# Patient Record
Sex: Female | Born: 1985 | Race: White | Hispanic: No | Marital: Married | Smoking: Never smoker
Health system: Southern US, Community
[De-identification: ages and names within clinical notes are randomized; demographics above are authoritative.]

## PROBLEM LIST (undated history)

## (undated) DIAGNOSIS — I214 Non-ST elevation (NSTEMI) myocardial infarction: Secondary | ICD-10-CM

## (undated) DIAGNOSIS — I2699 Other pulmonary embolism without acute cor pulmonale: Secondary | ICD-10-CM

## (undated) DIAGNOSIS — I2781 Cor pulmonale (chronic): Secondary | ICD-10-CM

## (undated) HISTORY — PX: TONSILLECTOMY: SUR1361

## (undated) HISTORY — PX: HERNIA REPAIR: SHX51

---
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
## 2015-09-04 ENCOUNTER — Emergency Department (HOSPITAL_COMMUNITY): Payer: Medicaid Other

## 2015-09-04 ENCOUNTER — Encounter (HOSPITAL_COMMUNITY): Payer: Self-pay

## 2015-09-04 ENCOUNTER — Emergency Department (HOSPITAL_COMMUNITY)
Admission: EM | Admit: 2015-09-04 | Discharge: 2015-09-04 | Disposition: A | Discharge status: Home / Self Care | Payer: Medicaid Other | Attending: Emergency Medicine | Admitting: Emergency Medicine

## 2015-09-04 DIAGNOSIS — I2782 Chronic pulmonary embolism: Secondary | ICD-10-CM | POA: Diagnosis not present

## 2015-09-04 DIAGNOSIS — R079 Chest pain, unspecified: Secondary | ICD-10-CM

## 2015-09-04 DIAGNOSIS — Z79899 Other long term (current) drug therapy: Secondary | ICD-10-CM | POA: Insufficient documentation

## 2015-09-04 DIAGNOSIS — M549 Dorsalgia, unspecified: Secondary | ICD-10-CM | POA: Insufficient documentation

## 2015-09-04 HISTORY — DX: Non-ST elevation (NSTEMI) myocardial infarction: I21.4

## 2015-09-04 HISTORY — DX: Cor pulmonale (chronic): I27.81

## 2015-09-04 HISTORY — DX: Other pulmonary embolism without acute cor pulmonale: I26.99

## 2015-09-04 LAB — BASIC METABOLIC PANEL
Anion gap: 6 (ref 5–15)
BUN: 8 mg/dL (ref 6–20)
CHLORIDE: 106 mmol/L (ref 101–111)
CO2: 26 mmol/L (ref 22–32)
Calcium: 9 mg/dL (ref 8.9–10.3)
Creatinine, Ser: 0.68 mg/dL (ref 0.44–1.00)
GFR calc Af Amer: >60 mL/min (ref >60)
GFR calc non Af Amer: >60 mL/min (ref >60)
GLUCOSE: 84 mg/dL (ref 65–99)
POTASSIUM: 3.7 mmol/L (ref 3.5–5.1)
Sodium: 138 mmol/L (ref 135–145)

## 2015-09-04 LAB — CBC
HEMATOCRIT: 37.9 % (ref 36.0–46.0)
HEMOGLOBIN: 12.5 g/dL (ref 12.0–15.0)
MCH: 29.2 pg (ref 26.0–34.0)
MCHC: 33 g/dL (ref 30.0–36.0)
MCV: 88.6 fL (ref 78.0–100.0)
Platelets: 313 10*3/uL (ref 150–400)
RBC: 4.28 MIL/uL (ref 3.87–5.11)
RDW: 13.7 % (ref 11.5–15.5)
WBC: 7.9 10*3/uL (ref 4.0–10.5)

## 2015-09-04 LAB — TROPONIN I
Troponin I: <0.03 ng/mL (ref <0.031)
Troponin I: <0.03 ng/mL (ref <0.031)

## 2015-09-04 MED ORDER — METHYLPREDNISOLONE SODIUM SUCC 125 MG IJ SOLR
125.0000 mg | Freq: Once | INTRAMUSCULAR | Status: AC
  Administered 2015-09-04: 125 mg via INTRAVENOUS
  Filled 2015-09-04: qty 2

## 2015-09-04 MED ORDER — DIPHENHYDRAMINE HCL 50 MG/ML IJ SOLN
50.0000 mg | Freq: Once | INTRAMUSCULAR | Status: AC
  Administered 2015-09-04: 50 mg via INTRAVENOUS

## 2015-09-04 MED ORDER — SODIUM CHLORIDE 0.9 % IV BOLUS (SEPSIS)
500.0000 mL | Freq: Once | INTRAVENOUS | Status: AC
  Administered 2015-09-04: 500 mL via INTRAVENOUS

## 2015-09-04 MED ORDER — IOPAMIDOL (ISOVUE-370) INJECTION 76%
100.0000 mL | Freq: Once | INTRAVENOUS | Status: AC | PRN
  Administered 2015-09-04: 100 mL via INTRAVENOUS

## 2015-09-04 MED ORDER — DIPHENHYDRAMINE HCL 50 MG/ML IJ SOLN
50.0000 mg | Freq: Once | INTRAMUSCULAR | Status: AC
  Administered 2015-09-04: 50 mg via INTRAVENOUS
  Filled 2015-09-04: qty 1

## 2015-09-04 MED ORDER — OXYCODONE-ACETAMINOPHEN 5-325 MG PO TABS
2.0000 | ORAL_TABLET | Freq: Once | ORAL | Status: AC
  Administered 2015-09-04: 2 via ORAL
  Filled 2015-09-04: qty 2

## 2015-09-04 MED ORDER — FENTANYL CITRATE (PF) 100 MCG/2ML IJ SOLN
50.0000 ug | Freq: Once | INTRAMUSCULAR | Status: AC
  Administered 2015-09-04: 50 ug via INTRAVENOUS
  Filled 2015-09-04: qty 2

## 2015-09-04 NOTE — ED Provider Notes (Addendum)
7:12 PM [facility], please see their note for full history, physical and decision making until this point. In brief this is a 30 y.o. year old female who presented to the ED tonight with Chest Pain     Had a recent admission for saddle embolus and is here with acute worsening of her chest pain as of last night. Is awaiting CT scan to evaluate for new PE and if so a change in medications.  CT scan returned without any new blood clots just resolving old pulmonary emboli. Patient pain-free at this time. Plan to follow with Dr. as directed and will continue Xarelto as needed. She also is low risk for ACS and had delta troponins which were negative making ACS unlikely.  Discharge instructions, including strict return precautions for new or worsening symptoms, given. Patient and/or family verbalized understanding and agreement with the plan as described.   Labs, studies and imaging reviewed by myself and considered in medical decision making if ordered. Imaging interpreted by radiology.  Labs Reviewed  BASIC METABOLIC PANEL  CBC  TROPONIN I  TROPONIN I    CT Angio Chest PE W/Cm &/Or Wo Cm  Final Result    DG Chest 2 View  Final Result      No Follow-up on file.   Marily MemosJason Giorgio Chabot, MD 09/06/15 1504

## 2015-09-04 NOTE — ED Provider Notes (Signed)
CSN: [id number]     Arrival date & time 09/04/15  1105 History  By signing my name below, I, Majel HomerPeyton Lee, attest that this documentation has been prepared under the direction and in the presence of Blane OharaJoshua Michel Eskelson, MD . Electronically Signed: Majel HomerPeyton Lee, Scribe. 09/04/2015. 12:26 PM.   Chief Complaint  Patient presents with  . Chest Pain   The history is provided by the patient. No language interpreter was used.   HPI Comments:  Olivia Byrd is a 30 y.o. female with a PMHx of NSTEMI and multiple PEs, who presents to the Emergency Department complaining of gradual onset, gradually worsening, chest pain that began last night. Pt states associated symptoms of back tightness.  Pt states [facility] for shortness of breath and chest pain that began three days to that visit. She was [facility] where she was diagnosed with acute saddle PE, multiple bilateral PE, and NSTEMI ([facility]). She was discharged on 09/02/15. She notes episode today is dissimilar as the back tightness was not present when she was diagnosed with her MI and PE last week. Pt states that she was taking birth control pills but was taken off of it after her PE was diagnosed.She denies family h/o blood clots. She is currently taking Xarelto BID and is complaint. Pt denies swelling in legs, fever, and cough.   Pt states she is allergic to CT contrast; broke out in rash.    Past Medical History  Diagnosis Date  . Pulmonary embolism (HCC)   . Cor pulmonale (chronic) (HCC)   . Non-ST elevated myocardial infarction [facility](HCC)    Past Surgical History  Procedure Laterality Date  . Hernia repair    . Tonsillectomy     History reviewed. No pertinent family history. Social History  Substance Use Topics  . Smoking status: Never Smoker   . Smokeless tobacco: None  . Alcohol Use: No   OB History    No data available     Review of Systems  Constitutional: Negative for  fever.  Respiratory: Negative for cough.   Cardiovascular: Positive for chest pain. Negative for leg swelling.  Musculoskeletal: Positive for back pain.  All other systems reviewed and are negative.  Allergies  Augmentin; Ceclor; Iodinated diagnostic agents; and Keflex  Home Medications   Prior to Admission medications   Medication Sig Start Date End Date Taking? Authorizing Provider  nitrofurantoin, macrocrystal-monohydrate, (MACROBID) 100 MG capsule TAKE 1 CAPSULE BY MOUTH 2 TIMES A DAY FOR 10 DAYS 08/26/15  Yes Historical Provider, MD  Rivaroxaban (XARELTO) 15 MG TABS tablet Take 15 mg by mouth 2 (two) times daily with a meal.  08/26/15 09/16/15 Yes Historical Provider, MD   BP 113/71 mmHg  Pulse 96  Temp(Src) 98.5 F (36.9 C) (Oral)  Resp 15  Ht 5\' 1"  (1.549 m)  Wt 164 lb (74.39 kg)  BMI 31.00 kg/m2  SpO2 99%  LMP 08/29/2015 (Approximate) Physical Exam  Constitutional: She is oriented to person, place, and time. She appears well-developed and well-nourished.  HENT:  Head: Normocephalic and atraumatic.  Eyes: Conjunctivae are normal. Right eye exhibits no discharge. Left eye exhibits no discharge.  Neck: Normal range of motion. Neck supple. No tracheal deviation present.  Cardiovascular: Normal rate and regular rhythm.   Pulmonary/Chest: Effort normal and breath sounds normal.  Lungs clear  Abdominal: Soft. She exhibits no distension. There is no tenderness. There is no guarding.  Abdomen soft,  non tender   Musculoskeletal: She exhibits no edema.  No tenderness in calves, no swelling on exam  Neurological: She is alert and oriented to person, place, and time.  Skin: Skin is warm. No rash noted.  Psychiatric: She has a normal mood and affect.  Nursing note and vitals reviewed.   ED Course  Procedures  DIAGNOSTIC STUDIES:  Oxygen Saturation is 100% on RA, normal by my interpretation.    COORDINATION OF CARE:  12:26 PM Discussed treatment plan with pt at bedside  which includes CT chest and pt agreed to plan.   EMERGENCY DEPARTMENT [place] CARDIAC EXAM performed by Blane Ohara, MD "Study: Limited Ultrasound of the heart and pericardium"  INDICATIONS:Dyspnea Multiple views of the heart and pericardium are obtained with a multi-frequency probe.  PERFORMED ZO:XWRUEA  IMAGES ARCHIVED?: Yes  FINDINGS: No pericardial effusion and Normal contractility  LIMITATIONS:  Body habitus  VIEWS USED: Subcostal 4 chamber, Parasternal long axis, Parasternal short axis, Apical 4 chamber  and Inferior Vena Cava  INTERPRETATION: Cardiac activity present, Pericardial effusioin absent, Cardiac tamponade absent, Volume status normal and Normal contractility  COMMENT:    Labs Review Labs Reviewed  BASIC METABOLIC PANEL  CBC  TROPONIN I  TROPONIN I    Imaging Review No results found. I have personally reviewed and evaluated these images and lab results as part of my medical decision-making.   EKG Interpretation   Date/Time:  Tuesday Sep 04 2015 11:41:37 EDT Ventricular Rate:  62 PR Interval:  122 QRS Duration: 83 QT Interval:  449 QTC Calculation: 456 R Axis:   36 Text Interpretation:  Sinus rhythm Confirmed by Shatoya Roets MD, Amanda Pote [phone])  on 09/04/2015 11:50:59 AM      MDM   Final diagnoses:  Chest pain, unspecified chest pain type  Other chronic pulmonary embolism without acute cor pulmonale (HCC)   Pt with atypical CP, hx of PE, compliant with medicines. Low risk cardiac.  Pt improved in ED, no CP on recheck, sob resolved.  Plan for CTA if no new clot burden continue with mediciness and fup with pcp.  Signed out for final dispo.  Filed Vitals:   09/04/15 1730 09/04/15 1900  BP: 111/68 113/71  Pulse: 94 96  Temp:    Resp: 17 15      Blane Ohara, MD 09/12/15 1109

## 2015-09-04 NOTE — ED Notes (Signed)
Pt given fluids with MD approval.

## 2015-09-04 NOTE — ED Notes (Signed)
[facility] and they diagnosed me with non-ST elevated MI.  [facility].  Was diagnosed with acute saddle pulmonary embolism with acute cor pulmonale.  Bi-lateral PE. Placed on Xarelto.  Started having a dull chest pain last night, spoke with my MD and they said to come to the ER.

## 2015-09-04 NOTE — ED Notes (Signed)
Delay in getting EKG due to quality of picture.

## 2015-09-04 NOTE — ED Notes (Signed)
MD at bedside. (Dr Clayborne DanaMesner)

## 2017-04-14 IMAGING — DX DG CHEST 2V
2 series · 2 of 2 positions shown · non-contrast
Comparison: CTA chest 08/24/2015.  Chest x-ray 08/23/2015.

CLINICAL DATA: 30-year-old with acute superimposed upon chronic
left-sided chest pain. Recent diagnosis of multiple bilateral
pulmonary emboli on 08/24/2015.

EXAM:
CHEST  2 VIEW

[chest pa]
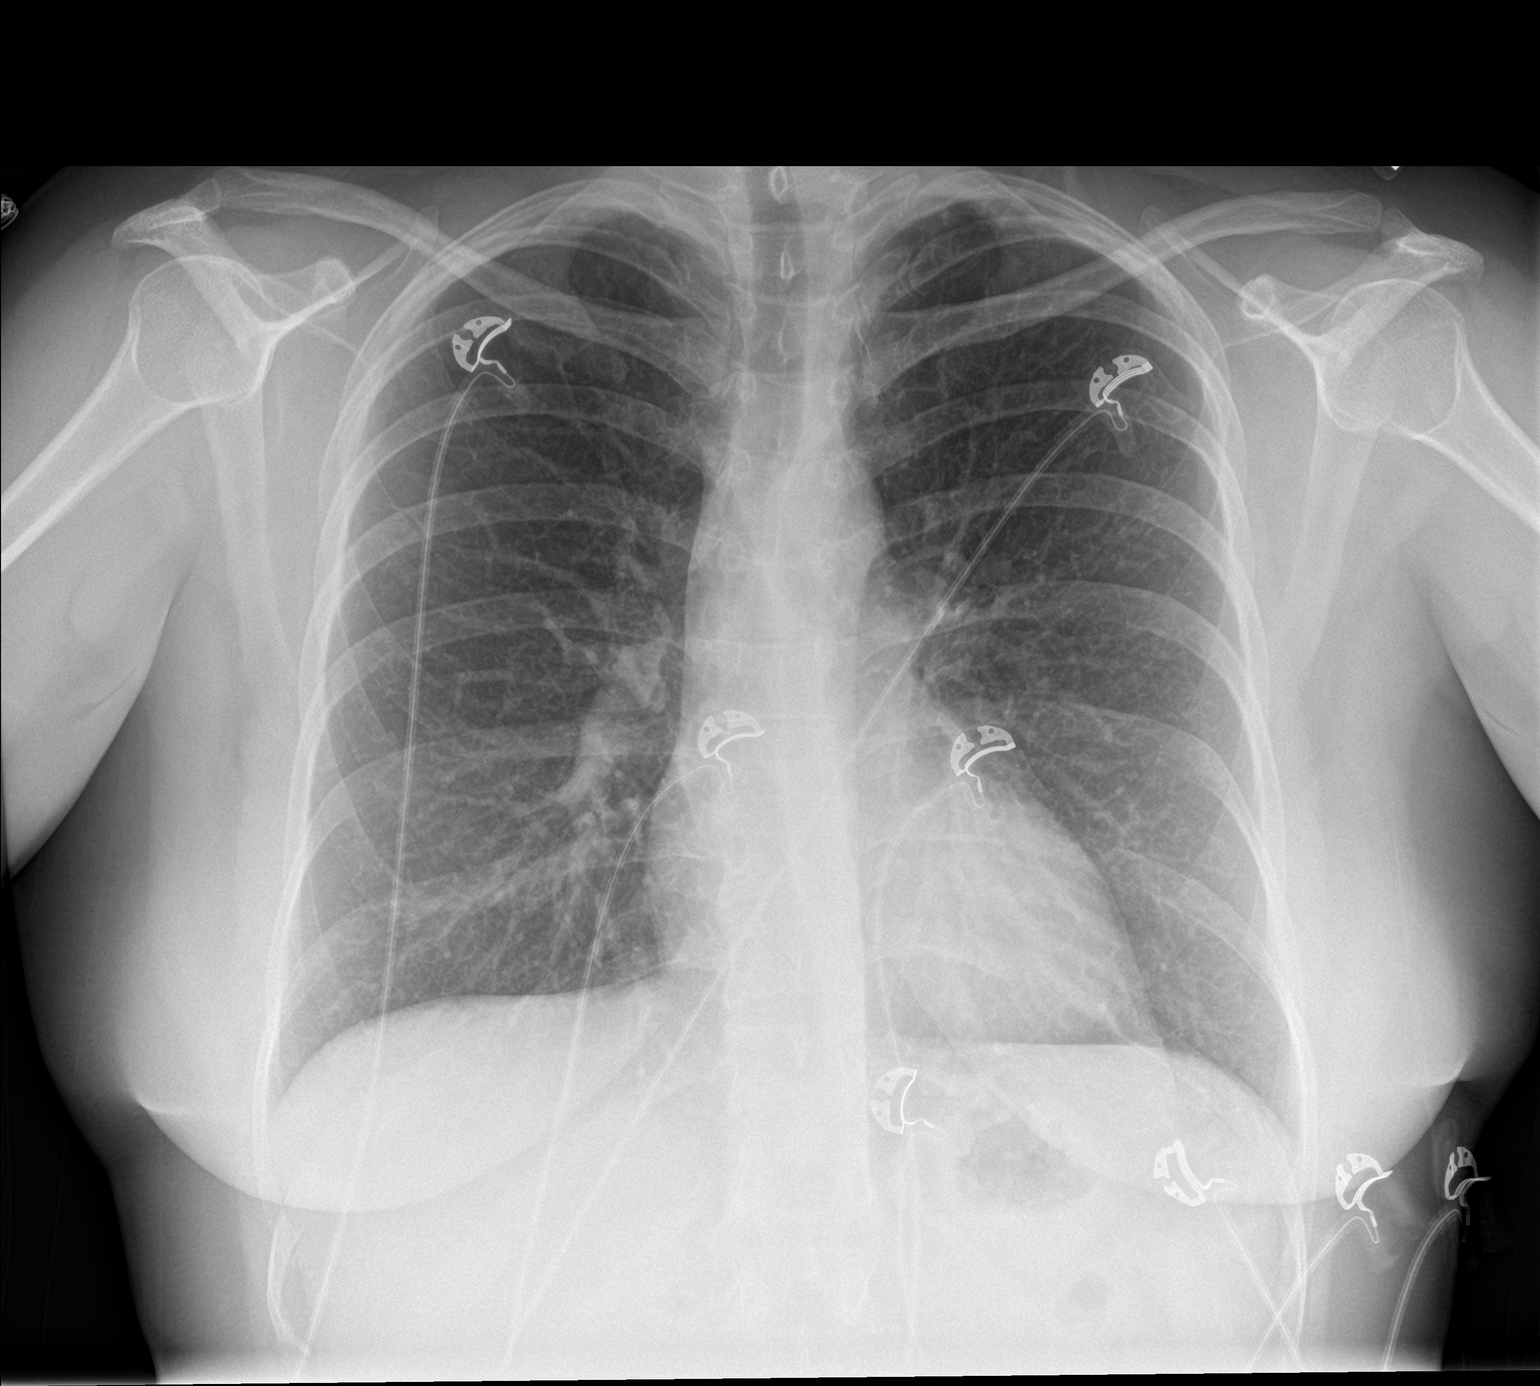

[chest lat]
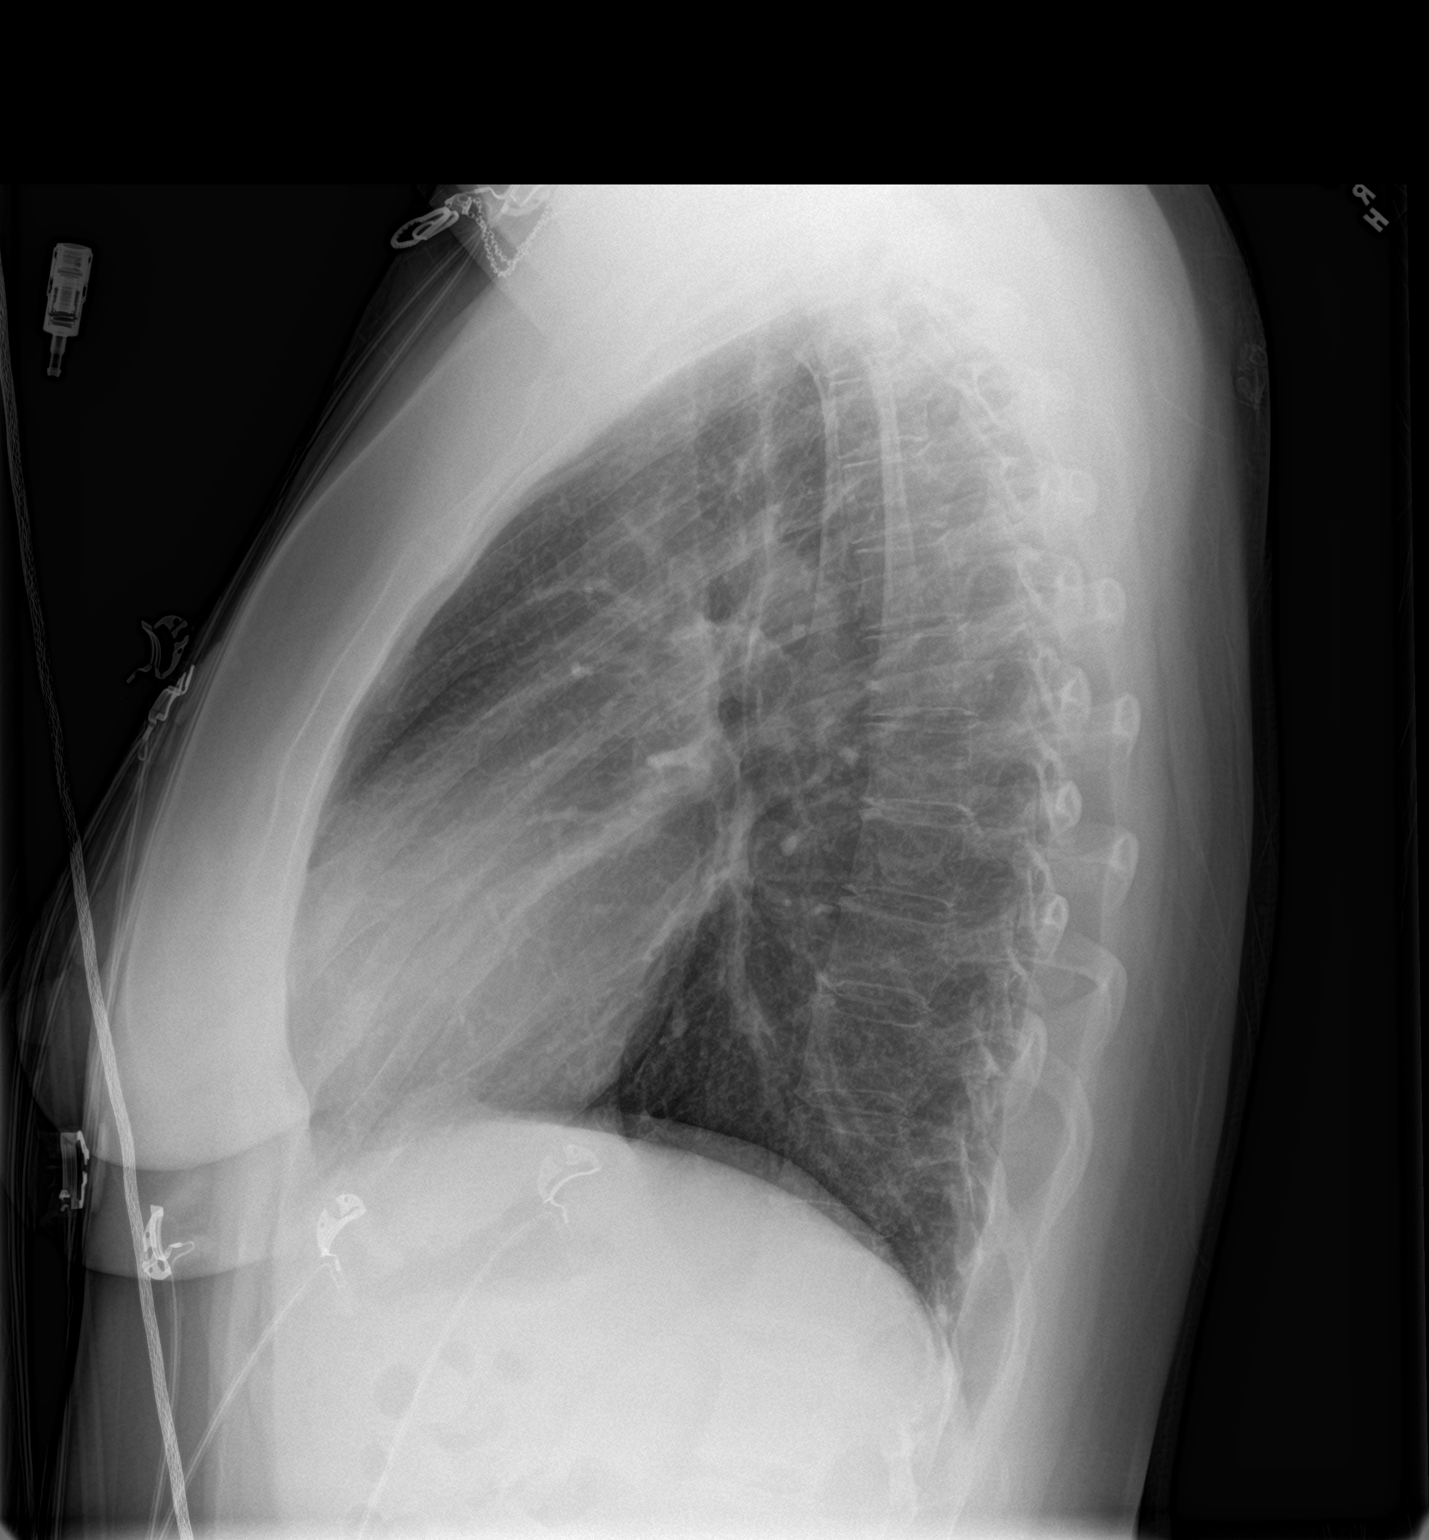

[2 of 2 positions shown; findings below may reference images not displayed]

FINDINGS: Cardiomediastinal silhouette unremarkable, unchanged. Lungs clear.
Bronchovascular markings normal. Pulmonary vascularity normal. No
visible pleural effusions. No pneumothorax. Visualized bony thorax
intact.
IMPRESSION: No acute cardiopulmonary disease.

## 2017-04-14 IMAGING — CT CT ANGIO CHEST
2 of 7 series · 17 of 46 positions shown · IV contrast (ISOVUE)
Comparison: CTA chest dated 08/24/2015

CLINICAL DATA: Recent bilateral P on 08/23/2015. Dull chest pain
starting last night.

EXAM:
CT ANGIOGRAPHY CHEST WITH CONTRAST
TECHNIQUE: Multidetector CT imaging of the chest was performed using the
standard protocol during bolus administration of intravenous
contrast. Multiplanar CT image reconstructions and MIPs were
obtained to evaluate the vascular anatomy.
CONTRAST:  100 cc Isovue 370

[Series 6: pe thins 1.0 · axial · 0.71mm/px · z∈[-280,-32]mm · 14 of 288 slices shown]
[im 20/288  lung]
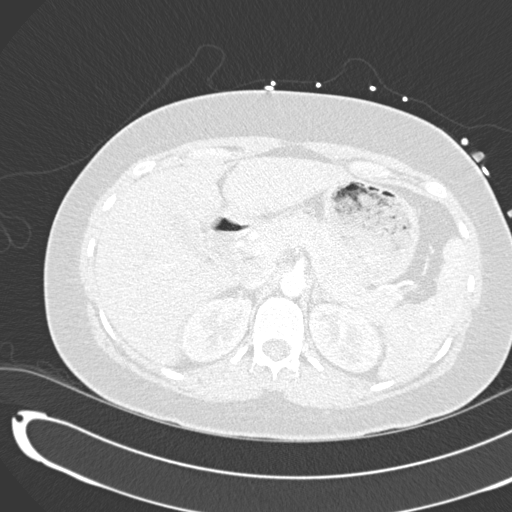
[im 39/288  soft-tissue]
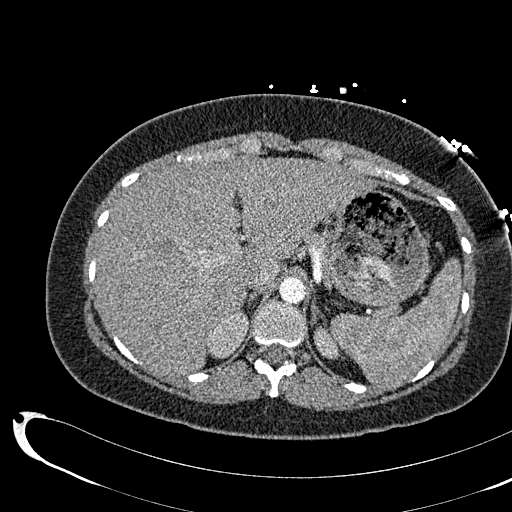
[im 58/288  lung]
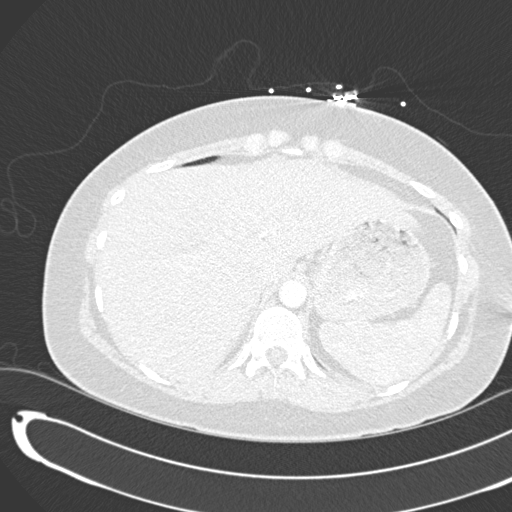
[im 77/288  soft-tissue]
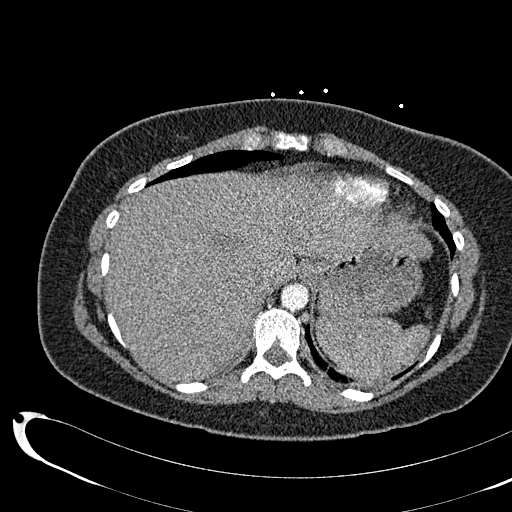
[im 96/288  lung]
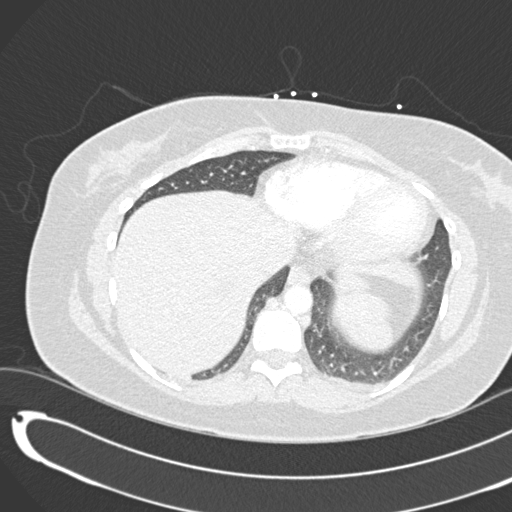
[im 115/288  soft-tissue]
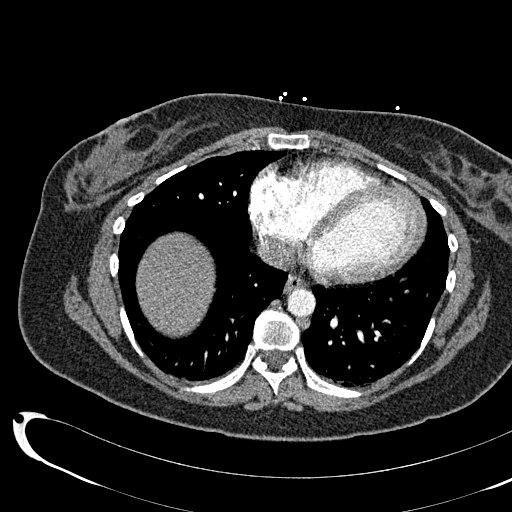
[im 134/288  lung]
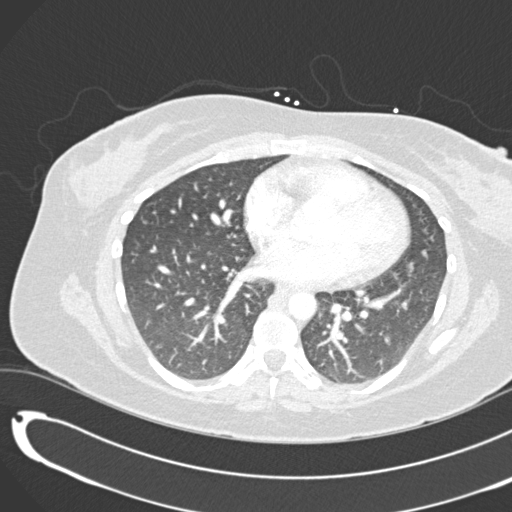
[im 154/288  soft-tissue]
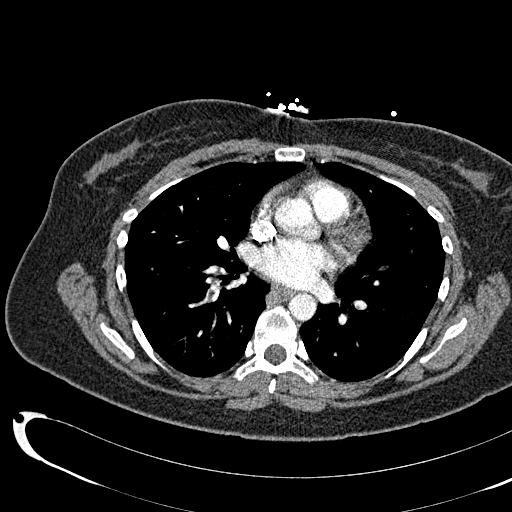
[im 173/288  lung]
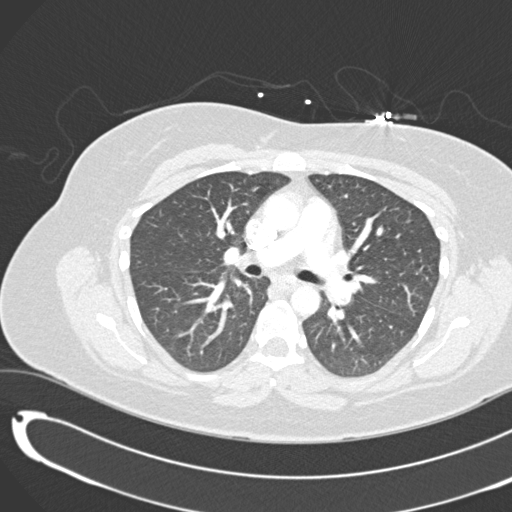
[im 192/288  soft-tissue]
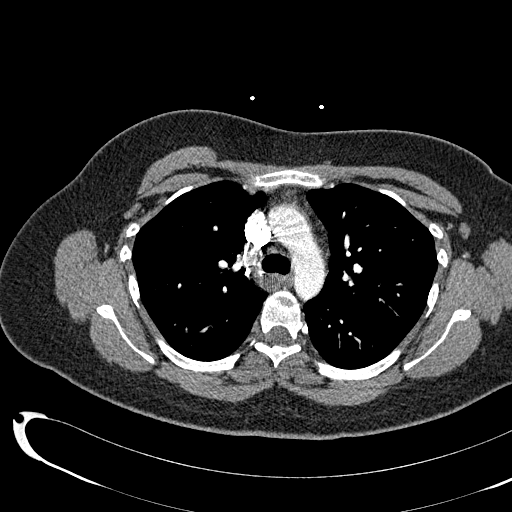
[im 211/288  lung]
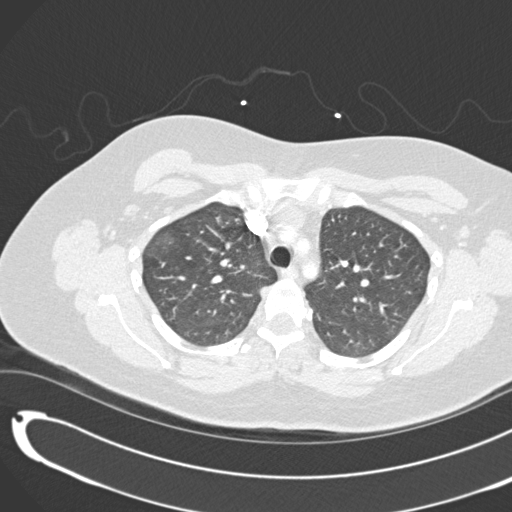
[im 230/288  soft-tissue]
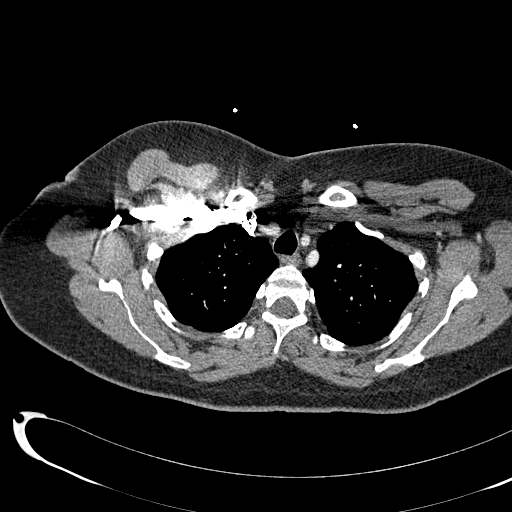
[im 249/288  lung]
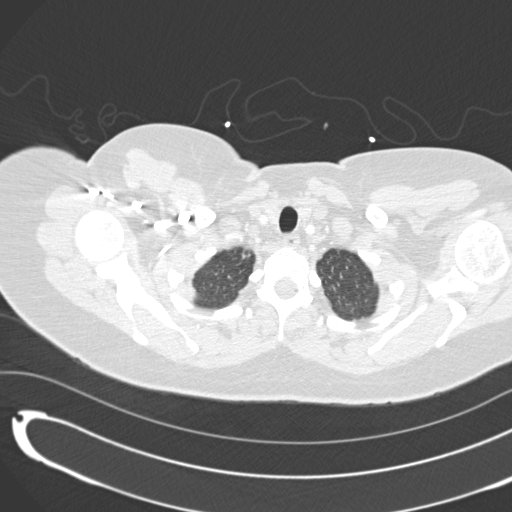
[im 268/288  soft-tissue]
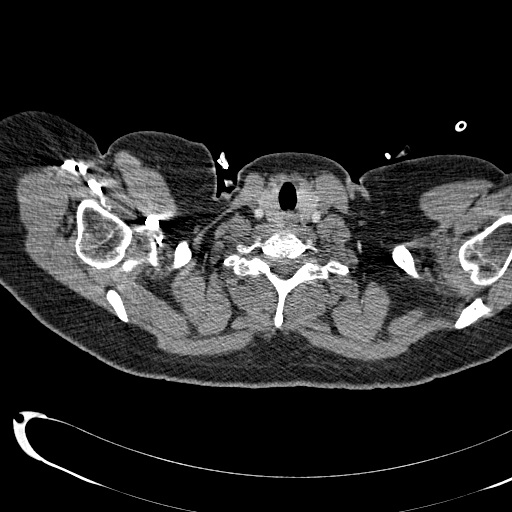

[Series 9: cor mpr 2.0 · coronal · 0.56mm/px · 3 of 128 slices shown]
[im 32/128  soft-tissue]
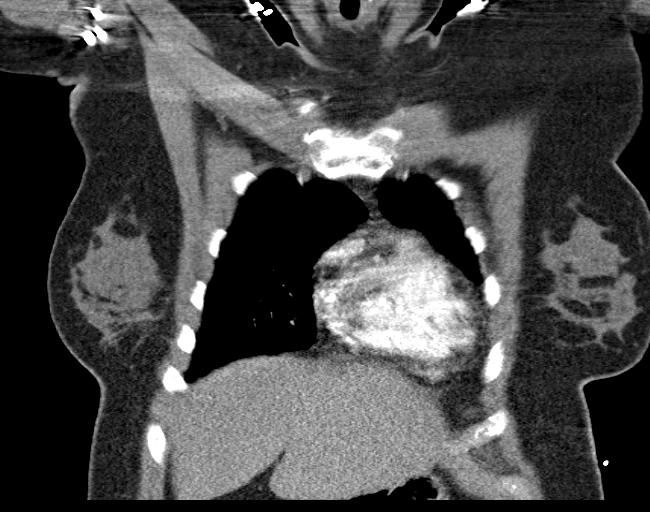
[im 64/128  soft-tissue]
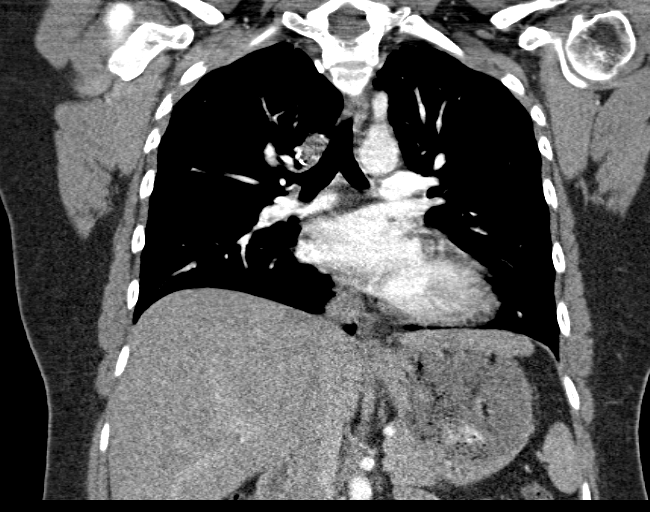
[im 96/128  soft-tissue]
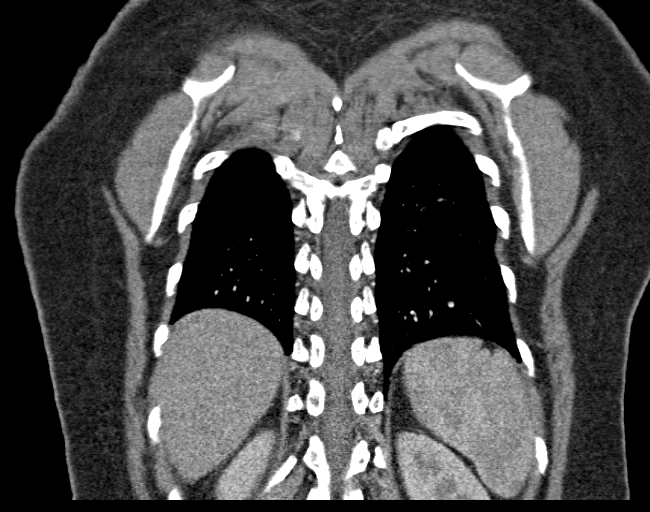

[17 of 46 positions shown; findings below may reference images not displayed]

FINDINGS: Mediastinum/Lymph Nodes: The pulmonary emboli within the distal
portions of each main pulmonary artery are significantly less dense
and confluent indicating good response to interval treatment. The
crossing saddle thrombus that was seen on the earlier exam has
resolved. The more peripheral pulmonary emboli within the segmental
and subsegmental pulmonary artery branches also appear less
prominent.

No new pulmonary emboli seen.

Thoracic aorta remains normal in caliber and configuration. No
aortic aneurysm or dissection. Heart size is normal. No pericardial
effusion. No masses or enlarged lymph nodes seen within the
mediastinum or perihilar regions.

Lungs/Pleura: Mild atelectasis noted at the right lung base
posteriorly. Small subtle area of ground-glass opacity again noted
within the lateral aspects of the right upper lobe, possible
associated pneumonitis. Lungs otherwise clear. No pleural effusion.
Trachea and central bronchi are unremarkable.

Upper abdomen: 2.4 cm stone within the gallbladder. Gallbladder is
incompletely imaged but there is no pericholecystic fluid or edema
to suggest acute cholecystitis. Limited images of the upper abdomen
are otherwise unremarkable.

Musculoskeletal: No acute or suspicious osseous lesion. Superficial
soft tissues are unremarkable.

Review of the MIP images confirms the above findings.
IMPRESSION: 1. Evidence of good response to interval therapy for the previously
diagnosed pulmonary emboli. Significantly decreased density and
confluence of the pulmonary emboli bilaterally. No new pulmonary
emboli identified.
2. Subtle small ground-glass opacity within the right upper lobe,
possibly a related mild pneumonitis. Mild atelectasis at the right
lung base. Lungs otherwise clear. No convincing evidence of
pulmonary infarction. No pleural effusion.
3. No aortic aneurysm or dissection.
4. Heart size is normal.  No pericardial effusion.
5. Cholelithiasis, incompletely imaged at the lower aspects of the
exam, but without evidence of acute cholecystitis.
